# Patient Record
Sex: Female | Born: 1965 | Race: White | Hispanic: No | Marital: Married | Smoking: Former smoker
Health system: Southern US, Community
[De-identification: ages and names within clinical notes are randomized; demographics above are authoritative.]

## PROBLEM LIST (undated history)

## (undated) DIAGNOSIS — D649 Anemia, unspecified: Secondary | ICD-10-CM

## (undated) DIAGNOSIS — R519 Headache, unspecified: Secondary | ICD-10-CM

## (undated) DIAGNOSIS — R51 Headache: Secondary | ICD-10-CM

## (undated) DIAGNOSIS — E039 Hypothyroidism, unspecified: Secondary | ICD-10-CM

## (undated) HISTORY — PX: BUNIONECTOMY: SHX129

## (undated) HISTORY — PX: ABDOMINAL HYSTERECTOMY: SHX81

## (undated) HISTORY — PX: OTHER SURGICAL HISTORY: SHX169

---

## 2015-05-21 ENCOUNTER — Other Ambulatory Visit: Payer: Self-pay | Admitting: Internal Medicine

## 2015-05-21 ENCOUNTER — Other Ambulatory Visit: Payer: Self-pay | Admitting: Family Medicine

## 2015-05-21 DIAGNOSIS — R1031 Right lower quadrant pain: Secondary | ICD-10-CM

## 2015-05-21 DIAGNOSIS — K469 Unspecified abdominal hernia without obstruction or gangrene: Secondary | ICD-10-CM

## 2015-05-24 ENCOUNTER — Ambulatory Visit
Admission: RE | Admit: 2015-05-24 | Discharge: 2015-05-24 | Disposition: A | Discharge status: Home / Self Care | Payer: Commercial Managed Care - HMO | Source: Ambulatory Visit | Attending: Family Medicine | Admitting: Family Medicine

## 2015-05-24 ENCOUNTER — Inpatient Hospital Stay
Admission: RE | Admit: 2015-05-24 | Discharge: 2015-05-24 | Disposition: A | Discharge status: Home / Self Care | Payer: Self-pay | Source: Ambulatory Visit | Attending: Internal Medicine | Admitting: Internal Medicine

## 2015-05-24 DIAGNOSIS — R1031 Right lower quadrant pain: Secondary | ICD-10-CM

## 2015-05-24 DIAGNOSIS — K469 Unspecified abdominal hernia without obstruction or gangrene: Secondary | ICD-10-CM

## 2015-05-24 MED ORDER — IOPAMIDOL (ISOVUE-300) INJECTION 61%
100.0000 mL | Freq: Once | INTRAVENOUS | Status: AC | PRN
  Administered 2015-05-24: 100 mL via INTRAVENOUS

## 2015-06-28 ENCOUNTER — Ambulatory Visit: Payer: Self-pay | Admitting: General Surgery

## 2015-06-29 NOTE — Pre-Procedure Instructions (Signed)
Elmeda Suver  06/29/2015     No Pharmacies Listed   Your procedure is scheduled on Fri, May 19 @ 12:10 PM   Report to [facility] Admitting at 10:10 AM   Call this number if you have problems the morning of surgery:  [phone]   Remember:  Do not eat food or drink liquids after midnight.   Take these medicines the morning of surgery with A SIP OF WATER Gabapentin,levothyroxine               No Goody's,BC's,Aleve,Aspirin,Ibuprofen,Motrin,Advil,Fish Oil   Do not wear jewelry, make-up or nail polish.  Do not wear lotions, powders, or perfumes.    Do not shave 48 hours prior to surgery.    Do not bring valuables to the hospital.  [facility] is not responsible for any belongings or valuables.  Contacts, dentures or bridgework may not be worn into surgery.  Leave your suitcase in the car.  After surgery it may be brought to your room.  For patients admitted to the hospital, discharge time will be determined by your treatment team.  Patients discharged the day of surgery will not be allowed to drive home.    [facility]  Before surgery, you can play an important role.  Because skin is not sterile, your skin needs to be as free of germs as possible.  You can reduce the number of germs on you skin by washing with CHG (chlorahexidine gluconate) soap before surgery.  CHG is an antiseptic cleaner which kills germs and bonds with the skin to continue killing germs even after washing.  Please DO NOT use if you have an allergy to CHG or antibacterial soaps.  If your skin becomes reddened/irritated stop using the CHG and inform your nurse when you arrive at Short Stay.  Do not shave (including legs and underarms) for at least 48 hours prior to the first CHG shower.  You may shave your face.  Please follow these instructions carefully:   1.  Shower with CHG Soap the night before surgery and the   morning of Surgery.  2.  If  you choose to wash your hair, wash your hair first as usual with your  normal shampoo.  3.  After you shampoo, rinse your hair and body thoroughly to remove the    Shampoo.  4.  Use CHG as you would any other liquid soap.  You can apply chg directly to the skin and wash gently with scrungie or a clean washcloth.  5.  Apply the CHG Soap to your body ONLY FROM THE NECK DOWN.        Do not use on open wounds or open sores.  Avoid contact with your eyes,       ears, mouth and genitals (private parts).  Wash genitals (private parts  with your normal soap.  6.  Wash thoroughly, paying special attention to the area where your surgery  will be performed.  7.  Thoroughly rinse your body with warm water from the neck down.  8.  DO NOT shower/wash with your normal soap after using and rinsing off  the CHG Soap.  9.  Pat yourself dry with a clean towel.            10.  Wear clean pajamas.            11.  Place clean sheets on your bed the night of your first shower  and do not sleep with pets.  Day of Surgery  Do not apply any lotions/deoderants the morning of surgery.  Please wear clean clothes to the hospital/surgery center.

## 2015-06-30 ENCOUNTER — Encounter (HOSPITAL_COMMUNITY)
Admission: RE | Admit: 2015-06-30 | Discharge: 2015-06-30 | Disposition: A | Discharge status: Home / Self Care | Payer: Commercial Managed Care - HMO | Source: Ambulatory Visit | Attending: General Surgery | Admitting: General Surgery

## 2015-06-30 ENCOUNTER — Encounter (HOSPITAL_COMMUNITY): Payer: Self-pay

## 2015-06-30 DIAGNOSIS — E039 Hypothyroidism, unspecified: Secondary | ICD-10-CM | POA: Diagnosis not present

## 2015-06-30 DIAGNOSIS — Z87891 Personal history of nicotine dependence: Secondary | ICD-10-CM | POA: Diagnosis not present

## 2015-06-30 DIAGNOSIS — Z791 Long term (current) use of non-steroidal anti-inflammatories (NSAID): Secondary | ICD-10-CM | POA: Diagnosis not present

## 2015-06-30 DIAGNOSIS — Z79899 Other long term (current) drug therapy: Secondary | ICD-10-CM | POA: Diagnosis not present

## 2015-06-30 DIAGNOSIS — K4090 Unilateral inguinal hernia, without obstruction or gangrene, not specified as recurrent: Secondary | ICD-10-CM | POA: Diagnosis present

## 2015-06-30 HISTORY — DX: Hypothyroidism, unspecified: E03.9

## 2015-06-30 HISTORY — DX: Headache, unspecified: R51.9

## 2015-06-30 HISTORY — DX: Headache: R51

## 2015-06-30 HISTORY — DX: Anemia, unspecified: D64.9

## 2015-06-30 LAB — CBC WITH DIFFERENTIAL/PLATELET
BASOS ABS: 0 10*3/uL (ref 0.0–0.1)
Basophils Relative: 1 %
Eosinophils Absolute: 0.1 10*3/uL (ref 0.0–0.7)
Eosinophils Relative: 2 %
HEMATOCRIT: 41.1 % (ref 36.0–46.0)
Hemoglobin: 13.8 g/dL (ref 12.0–15.0)
LYMPHS PCT: 34 %
Lymphs Abs: 2.2 10*3/uL (ref 0.7–4.0)
MCH: 29.2 pg (ref 26.0–34.0)
MCHC: 33.6 g/dL (ref 30.0–36.0)
MCV: 87.1 fL (ref 78.0–100.0)
Monocytes Absolute: 0.5 10*3/uL (ref 0.1–1.0)
Monocytes Relative: 7 %
NEUTROS ABS: 3.6 10*3/uL (ref 1.7–7.7)
NEUTROS PCT: 56 %
Platelets: 221 10*3/uL (ref 150–400)
RBC: 4.72 MIL/uL (ref 3.87–5.11)
RDW: 14.3 % (ref 11.5–15.5)
WBC: 6.3 10*3/uL (ref 4.0–10.5)

## 2015-07-01 MED ORDER — CEFAZOLIN SODIUM-DEXTROSE 2-4 GM/100ML-% IV SOLN
2.0000 g | INTRAVENOUS | Status: AC
  Administered 2015-07-02: 2 g via INTRAVENOUS
  Filled 2015-07-01: qty 100

## 2015-07-02 ENCOUNTER — Observation Stay (HOSPITAL_COMMUNITY)
Admission: RE | Admit: 2015-07-02 | Discharge: 2015-07-02 | Disposition: A | Discharge status: Home / Self Care | Payer: Commercial Managed Care - HMO | Source: Ambulatory Visit | Attending: General Surgery | Admitting: General Surgery

## 2015-07-02 ENCOUNTER — Ambulatory Visit (HOSPITAL_COMMUNITY): Payer: Commercial Managed Care - HMO | Admitting: Certified Registered"

## 2015-07-02 ENCOUNTER — Encounter (HOSPITAL_COMMUNITY)
Admission: RE | Disposition: A | Discharge status: Home / Self Care | Payer: Self-pay | Source: Ambulatory Visit | Attending: General Surgery

## 2015-07-02 ENCOUNTER — Encounter (HOSPITAL_COMMUNITY): Payer: Self-pay | Admitting: Certified Registered"

## 2015-07-02 DIAGNOSIS — Z9889 Other specified postprocedural states: Secondary | ICD-10-CM

## 2015-07-02 DIAGNOSIS — E039 Hypothyroidism, unspecified: Secondary | ICD-10-CM | POA: Insufficient documentation

## 2015-07-02 DIAGNOSIS — Z87891 Personal history of nicotine dependence: Secondary | ICD-10-CM | POA: Insufficient documentation

## 2015-07-02 DIAGNOSIS — Z8719 Personal history of other diseases of the digestive system: Secondary | ICD-10-CM

## 2015-07-02 DIAGNOSIS — K4090 Unilateral inguinal hernia, without obstruction or gangrene, not specified as recurrent: Principal | ICD-10-CM | POA: Insufficient documentation

## 2015-07-02 DIAGNOSIS — Z791 Long term (current) use of non-steroidal anti-inflammatories (NSAID): Secondary | ICD-10-CM | POA: Insufficient documentation

## 2015-07-02 DIAGNOSIS — Z79899 Other long term (current) drug therapy: Secondary | ICD-10-CM | POA: Insufficient documentation

## 2015-07-02 HISTORY — PX: INGUINAL HERNIA REPAIR: SHX194

## 2015-07-02 HISTORY — PX: INSERTION OF MESH: SHX5868

## 2015-07-02 SURGERY — REPAIR, HERNIA, INGUINAL, LAPAROSCOPIC
Anesthesia: General | Site: Groin | Laterality: Right

## 2015-07-02 MED ORDER — PROPOFOL 10 MG/ML IV BOLUS
INTRAVENOUS | Status: AC
  Filled 2015-07-02: qty 20

## 2015-07-02 MED ORDER — FENTANYL CITRATE (PF) 100 MCG/2ML IJ SOLN
25.0000 ug | INTRAMUSCULAR | Status: DC | PRN
  Administered 2015-07-02: 25 ug via INTRAVENOUS
  Administered 2015-07-02: 50 ug via INTRAVENOUS

## 2015-07-02 MED ORDER — FENTANYL CITRATE (PF) 100 MCG/2ML IJ SOLN
INTRAMUSCULAR | Status: DC | PRN
  Administered 2015-07-02: 100 ug via INTRAVENOUS
  Administered 2015-07-02: 50 ug via INTRAVENOUS
  Administered 2015-07-02: 100 ug via INTRAVENOUS

## 2015-07-02 MED ORDER — LACTATED RINGERS IV SOLN
INTRAVENOUS | Status: DC
  Administered 2015-07-02 (×2): via INTRAVENOUS

## 2015-07-02 MED ORDER — FENTANYL CITRATE (PF) 100 MCG/2ML IJ SOLN
INTRAMUSCULAR | Status: AC
  Filled 2015-07-02: qty 2

## 2015-07-02 MED ORDER — MIDAZOLAM HCL 2 MG/2ML IJ SOLN
INTRAMUSCULAR | Status: AC
  Filled 2015-07-02: qty 2

## 2015-07-02 MED ORDER — OXYCODONE-ACETAMINOPHEN 5-325 MG PO TABS: 1.0000 | ORAL_TABLET | ORAL | Status: DC | PRN

## 2015-07-02 MED ORDER — ACETAMINOPHEN 325 MG PO TABS
650.0000 mg | ORAL_TABLET | ORAL | Status: DC | PRN
  Administered 2015-07-02: 650 mg via ORAL

## 2015-07-02 MED ORDER — BUPIVACAINE HCL (PF) 0.25 % IJ SOLN
INTRAMUSCULAR | Status: AC
  Filled 2015-07-02: qty 30

## 2015-07-02 MED ORDER — ACETAMINOPHEN 325 MG PO TABS
ORAL_TABLET | ORAL | Status: AC
  Filled 2015-07-02: qty 2

## 2015-07-02 MED ORDER — BUPIVACAINE HCL 0.25 % IJ SOLN
INTRAMUSCULAR | Status: DC | PRN
Start: 2015-07-02 — End: 2015-07-02
  Administered 2015-07-02: 4 mL

## 2015-07-02 MED ORDER — MORPHINE SULFATE (PF) 2 MG/ML IV SOLN: 2.0000 mg | INTRAVENOUS | Status: DC | PRN

## 2015-07-02 MED ORDER — ROCURONIUM BROMIDE 100 MG/10ML IV SOLN
INTRAVENOUS | Status: DC | PRN
  Administered 2015-07-02: 40 mg via INTRAVENOUS

## 2015-07-02 MED ORDER — CHLORHEXIDINE GLUCONATE 4 % EX LIQD: 1.0000 "application " | Freq: Once | CUTANEOUS | Status: DC

## 2015-07-02 MED ORDER — SUGAMMADEX SODIUM 200 MG/2ML IV SOLN
INTRAVENOUS | Status: DC | PRN
  Administered 2015-07-02: 200 mg via INTRAVENOUS

## 2015-07-02 MED ORDER — 0.9 % SODIUM CHLORIDE (POUR BTL) OPTIME
TOPICAL | Status: DC | PRN
  Administered 2015-07-02: 1000 mL

## 2015-07-02 MED ORDER — PROPOFOL 10 MG/ML IV BOLUS
INTRAVENOUS | Status: DC | PRN
  Administered 2015-07-02: 30 mg via INTRAVENOUS
  Administered 2015-07-02: 100 mg via INTRAVENOUS

## 2015-07-02 MED ORDER — OXYCODONE HCL 5 MG PO TABS
5.0000 mg | ORAL_TABLET | ORAL | Status: DC | PRN
  Administered 2015-07-02: 10 mg via ORAL

## 2015-07-02 MED ORDER — OXYCODONE HCL 5 MG PO TABS
ORAL_TABLET | ORAL | Status: AC
  Filled 2015-07-02: qty 2

## 2015-07-02 MED ORDER — GABAPENTIN 300 MG PO CAPS: 600.0000 mg | ORAL_CAPSULE | Freq: Two times a day (BID) | ORAL | Status: DC

## 2015-07-02 MED ORDER — ONDANSETRON HCL 4 MG/2ML IJ SOLN
INTRAMUSCULAR | Status: DC | PRN
  Administered 2015-07-02: 4 mg via INTRAVENOUS

## 2015-07-02 MED ORDER — MIDAZOLAM HCL 5 MG/5ML IJ SOLN
INTRAMUSCULAR | Status: DC | PRN
  Administered 2015-07-02: 2 mg via INTRAVENOUS

## 2015-07-02 MED ORDER — SODIUM CHLORIDE 0.9 % IR SOLN
Status: DC | PRN
  Administered 2015-07-02: 1

## 2015-07-02 MED ORDER — ACETAMINOPHEN 650 MG RE SUPP: 650.0000 mg | RECTAL | Status: DC | PRN

## 2015-07-02 MED ORDER — LIDOCAINE HCL (CARDIAC) 20 MG/ML IV SOLN
INTRAVENOUS | Status: DC | PRN
  Administered 2015-07-02: 100 mg via INTRAVENOUS

## 2015-07-02 MED ORDER — FENTANYL CITRATE (PF) 250 MCG/5ML IJ SOLN
INTRAMUSCULAR | Status: AC
  Filled 2015-07-02: qty 5

## 2015-07-02 SURGICAL SUPPLY — 46 items
APPLIER CLIP 5 13 M/L LIGAMAX5 (MISCELLANEOUS)
BENZOIN TINCTURE PRP APPL 2/3 (GAUZE/BANDAGES/DRESSINGS) ×4 IMPLANT
CANISTER SUCTION 2500CC (MISCELLANEOUS) IMPLANT
CHLORAPREP W/TINT 26ML (MISCELLANEOUS) ×4 IMPLANT
CLIP APPLIE 5 13 M/L LIGAMAX5 (MISCELLANEOUS) IMPLANT
CLOSURE WOUND 1/2 X4 (GAUZE/BANDAGES/DRESSINGS) ×1
COVER SURGICAL LIGHT HANDLE (MISCELLANEOUS) ×4 IMPLANT
DISSECTOR BLUNT TIP ENDO 5MM (MISCELLANEOUS) IMPLANT
ELECT REM PT RETURN 9FT ADLT (ELECTROSURGICAL) ×4
ELECTRODE REM PT RTRN 9FT ADLT (ELECTROSURGICAL) ×2 IMPLANT
GAUZE SPONGE 2X2 8PLY STRL LF (GAUZE/BANDAGES/DRESSINGS) ×2 IMPLANT
GLOVE BIO SURGEON STRL SZ7.5 (GLOVE) ×8 IMPLANT
GLOVE BIOGEL PI IND STRL 7.0 (GLOVE) ×2 IMPLANT
GLOVE BIOGEL PI IND STRL 7.5 (GLOVE) ×2 IMPLANT
GLOVE BIOGEL PI INDICATOR 7.0 (GLOVE) ×2
GLOVE BIOGEL PI INDICATOR 7.5 (GLOVE) ×2
GLOVE SURG SS PI 7.0 STRL IVOR (GLOVE) ×4 IMPLANT
GOWN STRL REUS W/ TWL LRG LVL3 (GOWN DISPOSABLE) ×4 IMPLANT
GOWN STRL REUS W/ TWL XL LVL3 (GOWN DISPOSABLE) ×2 IMPLANT
GOWN STRL REUS W/TWL LRG LVL3 (GOWN DISPOSABLE) ×4
GOWN STRL REUS W/TWL XL LVL3 (GOWN DISPOSABLE) ×2
KIT BASIN OR (CUSTOM PROCEDURE TRAY) ×4 IMPLANT
KIT ROOM TURNOVER OR (KITS) ×4 IMPLANT
MESH 3DMAX 4X6 RT LRG (Mesh General) ×4 IMPLANT
NEEDLE INSUFFLATION 14GA 120MM (NEEDLE) IMPLANT
NS IRRIG 1000ML POUR BTL (IV SOLUTION) ×4 IMPLANT
PAD ARMBOARD 7.5X6 YLW CONV (MISCELLANEOUS) ×8 IMPLANT
RELOAD STAPLE HERNIA 4.0 BLUE (INSTRUMENTS) ×4 IMPLANT
RELOAD STAPLE HERNIA 4.8 BLK (STAPLE) IMPLANT
SCISSORS LAP 5X35 DISP (ENDOMECHANICALS) ×4 IMPLANT
SET IRRIG TUBING LAPAROSCOPIC (IRRIGATION / IRRIGATOR) IMPLANT
SET TROCAR LAP APPLE-HUNT 5MM (ENDOMECHANICALS) ×4 IMPLANT
SPONGE GAUZE 2X2 STER 10/PKG (GAUZE/BANDAGES/DRESSINGS) ×2
STAPLER HERNIA 12 8.5 360D (INSTRUMENTS) ×4 IMPLANT
STRIP CLOSURE SKIN 1/2X4 (GAUZE/BANDAGES/DRESSINGS) ×3 IMPLANT
SUT MNCRL AB 4-0 PS2 18 (SUTURE) ×4 IMPLANT
SUT VIC AB 1 CT1 27 (SUTURE)
SUT VIC AB 1 CT1 27XBRD ANBCTR (SUTURE) IMPLANT
SYRINGE TOOMEY DISP (SYRINGE) IMPLANT
TOWEL OR 17X24 6PK STRL BLUE (TOWEL DISPOSABLE) ×4 IMPLANT
TOWEL OR 17X26 10 PK STRL BLUE (TOWEL DISPOSABLE) ×4 IMPLANT
TRAY FOLEY CATH 16FR SILVER (SET/KITS/TRAYS/PACK) ×4 IMPLANT
TRAY LAPAROSCOPIC MC (CUSTOM PROCEDURE TRAY) ×4 IMPLANT
TROCAR XCEL 12X100 BLDLESS (ENDOMECHANICALS) ×4 IMPLANT
TUBING INSUFFLATION (TUBING) ×4 IMPLANT
WATER STERILE IRR 1000ML POUR (IV SOLUTION) ×4 IMPLANT

## 2015-07-02 NOTE — Op Note (Signed)
07/02/2015  12:24 PM  PATIENT:  Jessica Munoz  50 y.o. female  PRE-OPERATIVE DIAGNOSIS:  Right inguinal hernia   POST-OPERATIVE DIAGNOSIS:  Right indirect inguinal hernia   PROCEDURE:  Procedure(s): LAPAROSCOPIC RIGHT INGUINAL HERNIA WITH MESH  (Right) INSERTION OF MESH (Right)  SURGEON:  Surgeon(s) and Role:    * Ralene Ok, MD - Primary  ANESTHESIA:   local and general  EBL:  Total I/O In: 1000 [I.V.:1000] Out: -   BLOOD ADMINISTERED:none  DRAINS: none   LOCAL MEDICATIONS USED:  BUPIVICAINE   SPECIMEN:  Source of Specimen:  none  DISPOSITION OF SPECIMEN:  PATHOLOGY  COUNTS:  YES  TOURNIQUET:  * No tourniquets in log *  DICTATION: .Dragon Dictation   Counts: reported as correct x 2  Findings:  The patient had a small right indirect hernia  Indications for procedure:  The patient is a 50 year old female with a right hernia for several months. Patient complained of symptomatology to his right inguinal area. The patient was taken back for elective inguinal hernia repair.  Details of the procedure: The patient was taken back to the operating room. The patient was placed in supine position with bilateral SCDs in place.  The patient was prepped and draped in the usual sterile fashion.  After appropriate anitbiotics were confirmed, a time-out was confirmed and all facts were verified.  0.25% Marcaine was used to infiltrate the umbilical area. A 11-blade was used to cut down the skin and blunt dissection was used to get the anterior fashion.  The anterior fascia was incised approximately 1 cm and the muscles were retracted laterally. Blunt dissection was then used to create a space in the preperitoneal area. At this time a 10 mm camera was then introduced into the space and advanced the pubic tubercle and a 12 mm trocar was placed over this and insufflation was started.  At this time and space was created from medial to laterally the preperitoneal space.  Cooper's  ligament was initially cleaned off.  The hernia sac was identified in the right indirect space with associated lipoma within the indirect canal. Dissection of the hernia sac was undertaken.  The round ligament was cauterized and transected.  Once the hernia sac was taken down to approximately the umbilicus and the lipoma was dissected away from the internal ring,  a Bard 3D Max mesh, size: Large, was  introduced into the preperitoneal space.  The mesh was brought over to cover the direct and indirect hernia spaces.  This was anchored into place and secured to Cooper's ligament with 4.25mm staples from a Coviden hernia stapler. It was anchored to the anterior abdominal wall with 4.8 mm staples. The hernia sac was seen lying posterior to the mesh. There was no staples placed laterally. The insufflation was evacuated and the peritoneum was seen posterior to the mesh. The trochars were removed. The anterior fascia was reapproximated using #1 Vicryl on a UR- 6.  Intra-abdominal air was evacuated and the Veress needle removed. The skin was reapproximated using 4-0 Monocryl subcuticular fashion the patient was awakened from general anesthesia and taken to recovery in stable condition.   PLAN OF CARE: Discharge to home after PACU  PATIENT DISPOSITION:  PACU - hemodynamically stable.   Delay start of Pharmacological VTE agent (>24hrs) due to surgical blood loss or risk of bleeding: not applicable

## 2015-07-02 NOTE — H&P (Signed)
History of Present Illness Ralene Ok MD; 06/10/2015 10:20 AM) Patient words: reck.  The patient is a 50 year old female who presents with an inguinal hernia. The patient is a 50 year old female who is referred by Kelton Pillar, M.D. for evaluation of a right inguinal hernia. Patient states that she notices a bulge to the right inguinal area. She underwent a CT scan which revealed a right inguinal hernia.  Patient works at [employer], does some heavy lifting   Other Problems Marjean Donna, CMA; 06/10/2015 9:56 AM) Bladder Problems Hemorrhoids Migraine Headache Thyroid Disease  Past Surgical History Marjean Donna, [place]; 06/10/2015 9:56 AM) Hysterectomy (not due to cancer) - Partial  Diagnostic Studies History Marjean Donna, CMA; 06/10/2015 9:56 AM) Colonoscopy never Mammogram within last year Pap Smear 1-5 years ago  Allergies Davy Pique Bynum, CMA; 06/10/2015 9:58 AM) No Known Drug Allergies04/27/2017  Medication History (Sonya Bynum, CMA; 06/10/2015 9:59 AM) Gabapentin (300MG  Capsule, Oral) Active. Ketoprofen (75MG  Capsule, Oral) Active. Levothyroxine Sodium (50MCG Tablet, Oral) Active. Methocarbamol (500MG  Tablet, Oral) Active. ValACYclovir HCl (1GM Tablet, Oral) Active. Valtrex (1GM Tablet, Oral) Active. Medications Reconciled  Social History Marjean Donna, CMA; 06/10/2015 9:56 AM) Alcohol use Moderate alcohol use. Caffeine use Coffee. No drug use Tobacco use Former smoker.  Family History Marjean Donna, [place]; 06/10/2015 9:56 AM) First Degree Relatives No pertinent family history  Pregnancy / Birth History Marjean Donna, [place]; 06/10/2015 9:56 AM) Durenda Age 4 Maternal age 28-20 Para 4    Review of Systems Davy Pique Bynum CMA; 06/10/2015 9:56 AM) General Not Present- Appetite Loss, Chills, Fatigue, Fever, Night Sweats, Weight Gain and Weight Loss. Skin Not Present- Change in Wart/Mole, Dryness, Hives, Jaundice, New Lesions, Non-Healing Wounds, Rash  and Ulcer. HEENT Present- Seasonal Allergies and Wears glasses/contact lenses. Not Present- Earache, Hearing Loss, Hoarseness, Nose Bleed, Oral Ulcers, Ringing in the Ears, Sinus Pain, Sore Throat, Visual Disturbances and Yellow Eyes. Respiratory Not Present- Bloody sputum, Chronic Cough, Difficulty Breathing, Snoring and Wheezing. Breast Not Present- Breast Mass, Breast Pain, Nipple Discharge and Skin Changes. Cardiovascular Present- Leg Cramps. Not Present- Chest Pain, Difficulty Breathing Lying Down, Palpitations, Rapid Heart Rate, Shortness of Breath and Swelling of Extremities. Gastrointestinal Present- Change in Bowel Habits and Hemorrhoids. Not Present- Abdominal Pain, Bloating, Bloody Stool, Chronic diarrhea, Constipation, Difficulty Swallowing, Excessive gas, Gets full quickly at meals, Indigestion, Nausea, Rectal Pain and Vomiting. Female Genitourinary Present- Frequency, Pelvic Pain and Urgency. Not Present- Nocturia and Painful Urination. Musculoskeletal Not Present- Back Pain, Joint Pain, Joint Stiffness, Muscle Pain, Muscle Weakness and Swelling of Extremities. Neurological Present- Headaches. Not Present- Decreased Memory, Fainting, Numbness, Seizures, Tingling, Tremor, Trouble walking and Weakness. Psychiatric Not Present- Anxiety, Bipolar, Change in Sleep Pattern, Depression, Fearful and Frequent crying. Endocrine Not Present- Cold Intolerance, Excessive Hunger, Hair Changes, Heat Intolerance, Hot flashes and New Diabetes. Hematology Present- Easy Bruising. Not Present- Excessive bleeding, Gland problems, HIV and Persistent Infections.  Vitals (Sonya Bynum CMA; 06/10/2015 9:57 AM) 06/10/2015 9:57 AM Weight: 142 lb Height: 63in Body Surface Area: 1.67 m Body Mass Index: 25.15 kg/m  Temp.: 72F(Temporal)  Pulse: 76 (Regular)  BP: 128/80 (Sitting, Left Arm, Standard)       Physical Exam Ralene Ok MD; 06/10/2015 10:20 AM) General Mental  Status-Alert. General Appearance-Consistent with stated age. Hydration-Well hydrated. Voice-Normal.  Head and Neck Head-normocephalic, atraumatic with no lesions or palpable masses. Trachea-midline.  Eye Eyeball - Bilateral-Extraocular movements intact. Sclera/Conjunctiva - Bilateral-No scleral icterus.  Chest and Lung Exam Chest and lung exam reveals -quiet, even and  easy respiratory effort with no use of accessory muscles. Inspection Chest Wall - Normal. Back - normal.  Cardiovascular Cardiovascular examination reveals -normal heart sounds, regular rate and rhythm with no murmurs.  Abdomen Inspection Skin - Scar - no surgical scars. Hernias - Inguinal hernia - Right - Reducible(Likely direct). Palpation/Percussion Normal exam - Soft, Non Tender, No Rebound tenderness, No Rigidity (guarding) and No hepatosplenomegaly. Auscultation Normal exam - Bowel sounds normal.  Neurologic Neurologic evaluation reveals -alert and oriented x 3 with no impairment of recent or remote memory. Mental Status-Normal.  Musculoskeletal Normal Exam - Left-Upper Extremity Strength Normal and Lower Extremity Strength Normal. Normal Exam - Right-Upper Extremity Strength Normal, Lower Extremity Weakness.    Assessment & Plan Ralene Ok MD; 06/10/2015 10:21 AM) RIGHT INGUINAL HERNIA (K40.90) Impression: 50 year old female with a right inguinal hernia.  1. The patient will like to proceed to the operating room for laparoscopic right inguinal hernia repair with mesh.  2. I discussed with the patient the signs and symptoms of incarceration and strangulation and the need to proceed to the ER should they occur.  3. I discussed with the patient the risks and benefits of the procedure to include but not limited to: Infection, bleeding, damage to surrounding structures, possible need for further surgery, possible nerve pain, and possible recurrence. The patient was  understanding and wishes to proceed.

## 2015-07-02 NOTE — Anesthesia Procedure Notes (Signed)
Procedure Name: Intubation Date/Time: 07/02/2015 11:44 AM Performed by: Manuela Schwartz B Pre-anesthesia Checklist: Patient identified, Emergency Drugs available, Suction available, Patient being monitored and Timeout performed Patient Re-evaluated:Patient Re-evaluated prior to inductionOxygen Delivery Method: Circle system utilized Preoxygenation: Pre-oxygenation with 100% oxygen Intubation Type: IV induction Ventilation: Mask ventilation without difficulty Laryngoscope Size: Mac and 3 Grade View: Grade I Tube type: Oral Tube size: 7.0 mm Number of attempts: 1 Airway Equipment and Method: Stylet Placement Confirmation: ETT inserted through vocal cords under direct vision,  positive ETCO2 and breath sounds checked- equal and bilateral Secured at: 21 cm Tube secured with: Tape Dental Injury: Teeth and Oropharynx as per pre-operative assessment

## 2015-07-02 NOTE — Discharge Instructions (Signed)
[facility] _______[facility] ° °INGUINAL HERNIA REPAIR: POST OP INSTRUCTIONS ° °Always review your discharge instruction sheet given to you by the facility where your surgery was performed. °IF YOU HAVE DISABILITY OR FAMILY LEAVE FORMS, YOU MUST BRING THEM TO THE OFFICE FOR PROCESSING.   °DO NOT GIVE THEM TO YOUR DOCTOR. ° °1. A  prescription for pain medication may be given to you upon discharge.  Take your pain medication as prescribed, if needed.  If narcotic pain medicine is not needed, then you may take acetaminophen (Tylenol) or ibuprofen (Advil) as needed. °2. Take your usually prescribed medications unless otherwise directed. °3. If you need a refill on your pain medication, please contact your pharmacy.  They will contact our office to request authorization. Prescriptions will not be filled after 5 pm or on week-ends. °4. You should follow a light diet the first 24 hours after arrival home, such as soup and crackers, etc.  Be sure to include lots of fluids daily.  Resume your normal diet the day after surgery. °5. Most patients will experience some swelling and bruising around the umbilicus or in the groin and scrotum.  Ice packs and reclining will help.  Swelling and bruising can take several days to resolve.  °6. It is common to experience some constipation if taking pain medication after surgery.  Increasing fluid intake and taking a stool softener (such as Colace) will usually help or prevent this problem from occurring.  A mild laxative (Milk of Magnesia or Miralax) should be taken according to package directions if there are no bowel movements after 48 hours. °7. Unless discharge instructions indicate otherwise, you may remove your bandages 24-48 hours after surgery, and you may shower at that time.  You may have steri-strips (small skin tapes) in place directly over the incision.  These strips should be left on the skin for 7-10 days.  If your surgeon used skin glue on the incision, you  may shower in 24 hours.  The glue will flake off over the next 2-3 weeks.  Any sutures or staples will be removed at the office during your follow-up visit. °8. ACTIVITIES:  You may resume regular (light) daily activities beginning the next day--such as daily self-care, walking, climbing stairs--gradually increasing activities as tolerated.  You may have sexual intercourse when it is comfortable.  Refrain from any heavy lifting or straining until approved by your doctor. °a. You may drive when you are no longer taking prescription pain medication, you can comfortably wear a seatbelt, and you can safely maneuver your car and apply brakes. °b. RETURN TO WORK:  __________________________________________________________ °9. You should see your doctor in the office for a follow-up appointment approximately 2-3 weeks after your surgery.  Make sure that you call for this appointment within a day or two after you arrive home to insure a convenient appointment time. °10. OTHER INSTRUCTIONS:  __________________________________________________________________________________________________________________________________________________________________________________________  °WHEN TO CALL YOUR DOCTOR: °1. Fever over 101.0 °2. Inability to urinate °3. Nausea and/or vomiting °4. Extreme swelling or bruising °5. Continued bleeding from incision. °6. Increased pain, redness, or drainage from the incision ° °The clinic staff is available to answer your questions during regular business hours.  Please don’t hesitate to call and ask to speak to one of the nurses for clinical concerns.  If you have a medical emergency, go to the nearest emergency room or call 911.  A surgeon from [facility] is always on call at the hospital ° ° °[address], [address], [place], [place]  [address] ? ° [address] [address], [place], [place]   [address] °[phone] ? [phone] ? FAX [phone] °Web site:  [email or web address] ° °

## 2015-07-02 NOTE — Anesthesia Preprocedure Evaluation (Signed)
Anesthesia Evaluation  Patient identified by MRN, date of birth, ID band Patient awake    Reviewed: Allergy & Precautions, NPO status , Patient's Chart, lab work & pertinent test results  Airway Mallampati: II  TM Distance: >3 FB Neck ROM: Full    Dental  (+) Teeth Intact, Dental Advisory Given   Pulmonary former smoker,    Pulmonary exam normal breath sounds clear to auscultation       Cardiovascular Exercise Tolerance: Good negative cardio ROS Normal cardiovascular exam Rhythm:Regular Rate:Normal     Neuro/Psych  Headaches, negative psych ROS   GI/Hepatic negative GI ROS, Neg liver ROS,   Endo/Other  Hypothyroidism   Renal/GU negative Renal ROS     Musculoskeletal negative musculoskeletal ROS (+)   Abdominal   Peds  Hematology negative hematology ROS (+)   Anesthesia Other Findings Day of surgery medications reviewed with the patient.  Reproductive/Obstetrics                             Anesthesia Physical Anesthesia Plan  ASA: II  Anesthesia Plan: General   Post-op Pain Management:    Induction: Intravenous  Airway Management Planned: Oral ETT  Additional Equipment:   Intra-op Plan:   Post-operative Plan: Extubation in OR  Informed Consent: I have reviewed the patients History and Physical, chart, labs and discussed the procedure including the risks, benefits and alternatives for the proposed anesthesia with the patient or authorized representative who has indicated his/her understanding and acceptance.   Dental advisory given  Plan Discussed with: CRNA  Anesthesia Plan Comments: (Risks/benefits of general anesthesia discussed with patient including risk of damage to teeth, lips, gum, and tongue, nausea/vomiting, allergic reactions to medications, and the possibility of heart attack, stroke and death.  All patient questions answered.  Patient wishes to proceed.)         Anesthesia Quick Evaluation

## 2015-07-02 NOTE — Anesthesia Postprocedure Evaluation (Signed)
Anesthesia Post Note  Patient: Jessica Munoz  Procedure(s) Performed: Procedure(s) (LRB): LAPAROSCOPIC RIGHT INGUINAL HERNIA WITH MESH  (Right) INSERTION OF MESH (Right)  Patient location during evaluation: PACU Anesthesia Type: General Level of consciousness: awake and alert Pain management: pain level controlled Vital Signs Assessment: post-procedure vital signs reviewed and stable Respiratory status: spontaneous breathing, nonlabored ventilation, respiratory function stable and patient connected to nasal cannula oxygen Cardiovascular status: blood pressure returned to baseline and stable Postop Assessment: no signs of nausea or vomiting Anesthetic complications: no    Last Vitals:  Filed Vitals:   07/02/15 1238 07/02/15 1254  BP: 129/93 127/84  Pulse: 88 80  Temp: 36.7 C   Resp: 12 14    Last Pain:  Filed Vitals:   07/02/15 1349  PainSc: 3                  Catalina Gravel

## 2015-07-02 NOTE — Progress Notes (Signed)
Partial teeth and eyeglasses returned to  Pt on admission to PACU by CRNA

## 2015-07-02 NOTE — Transfer of Care (Signed)
Immediate Anesthesia Transfer of Care Note  Patient: Jessica Munoz  Procedure(s) Performed: Procedure(s): LAPAROSCOPIC RIGHT INGUINAL HERNIA WITH MESH  (Right) INSERTION OF MESH (Right)  Patient Location: PACU  Anesthesia Type:General  Level of Consciousness: awake, alert  and oriented  Airway & Oxygen Therapy: Patient Spontanous Breathing and Patient connected to nasal cannula oxygen  Post-op Assessment: Report given to RN and Post -op Vital signs reviewed and stable  Post vital signs: Reviewed and stable  Last Vitals:  Filed Vitals:   07/02/15 1108  BP: 134/77  Pulse: 77  Temp: 36.8 C  Resp: 18    Last Pain: There were no vitals filed for this visit.       Complications: No apparent anesthesia complications

## 2015-07-02 NOTE — Interval H&P Note (Signed)
History and Physical Interval Note:  07/02/2015 11:12 AM  Jessica Munoz  has presented today for surgery, with the diagnosis of Right inguinal hernia   The various methods of treatment have been discussed with the patient and family. After consideration of risks, benefits and other options for treatment, the patient has consented to  Procedure(s): LAPAROSCOPIC RIGHT INGUINAL HERNIA WITH MESH  (Right) INSERTION OF MESH (Right) as a surgical intervention .  The patient's history has been reviewed, patient examined, no change in status, stable for surgery.  I have reviewed the patient's chart and labs.  Questions were answered to the patient's satisfaction.     Rosario Jacks., Anne Hahn

## 2015-07-05 NOTE — Discharge Summary (Signed)
Physician Discharge Summary  Patient ID: [name] MRN: [id number] DOB/AGE: 50-Jun-1967 50 y.o.  Admit date: 07/02/2015 Discharge date: 07/05/2015  Admission Diagnoses:status post hernia repair  Discharge Diagnoses:  Active Problems:   S/P hernia repair   Discharged Condition: good  Hospital Course: patient is a 50 year old female status post hernia repair.  Patient was doing well postoperatively.  She was sent to the floor.  She started on a liquid diet advanced to a regular diet.  She tolerated that well.  She was afebrile, and bleeding, had good pain control, and was deeme for discharge and disc.  Consults: None  Significant Diagnostic Studies: none  Treatments: surgery: as above  Discharge Exam: Blood pressure 126/82, pulse 80, temperature 98 F (36.7 C), temperature source Oral, resp. rate 16, height 5\' 3"  (1.6 m), weight 66.356 kg (146 lb 4.6 oz), SpO2 97 %. General appearance: alert and cooperative Cardio: regular rate and rhythm, S1, S2 normal, no murmur, click, rub or gallop GI: soft, non-tender; bowel sounds normal; no masses,  no organomegaly and incision c//di  Disposition: 01-Home or Self Care     Medication List    TAKE these medications        gabapentin 300 MG capsule  Commonly known as:  NEURONTIN  Take 600-900 mg by mouth 2 (two) times daily. 600mg  in the morning and 900mg  in the evening     isometheptene-acetaminophen-dichloralphenazone 65-100-325 MG capsule  Commonly known as:  MIDRIN  Take 1 capsule by mouth 4 (four) times daily as needed for migraine. Maximum 5 capsules in 12 hours for migraine headaches, 8 capsules in 24 hours for tension headaches.     ketoprofen 75 MG capsule  Commonly known as:  ORUDIS  Take 75 mg by mouth 3 (three) times daily as needed. headache     levothyroxine 50 MCG tablet  Commonly known as:  SYNTHROID, LEVOTHROID  Take 50 mcg by mouth daily.     oxyCODONE-acetaminophen 5-325 MG tablet  Commonly known as:   ROXICET  Take 1-2 tablets by mouth every 4 (four) hours as needed.     valACYclovir 1000 MG tablet  Commonly known as:  VALTREX  Take 1 g by mouth 2 (two) times daily as needed. Cold sore           Follow-up Information    Follow up with Reyes Ivan, MD. Schedule an appointment as soon as possible for a visit in 2 weeks.   Specialty:  General Surgery   Why:  For wound re-check   Contact information:   [address] [address] [phone]       Signed: Rosario Jacks., [facility] 07/05/2015, 4:56 PM

## 2015-07-06 ENCOUNTER — Encounter (HOSPITAL_COMMUNITY): Payer: Self-pay | Admitting: General Surgery

## 2016-03-17 DIAGNOSIS — G4453 Primary thunderclap headache: Secondary | ICD-10-CM | POA: Diagnosis not present

## 2016-03-17 DIAGNOSIS — G44219 Episodic tension-type headache, not intractable: Secondary | ICD-10-CM | POA: Diagnosis not present

## 2016-03-17 DIAGNOSIS — G43019 Migraine without aura, intractable, without status migrainosus: Secondary | ICD-10-CM | POA: Diagnosis not present

## 2016-03-24 ENCOUNTER — Other Ambulatory Visit: Payer: Self-pay | Admitting: Neurology

## 2016-03-24 DIAGNOSIS — R519 Headache, unspecified: Secondary | ICD-10-CM

## 2016-03-24 DIAGNOSIS — R51 Headache: Principal | ICD-10-CM

## 2016-04-03 ENCOUNTER — Ambulatory Visit
Admission: RE | Admit: 2016-04-03 | Discharge: 2016-04-03 | Disposition: A | Discharge status: Home / Self Care | Payer: Commercial Managed Care - HMO | Source: Ambulatory Visit | Attending: Neurology | Admitting: Neurology

## 2016-04-03 DIAGNOSIS — R519 Headache, unspecified: Secondary | ICD-10-CM

## 2016-04-03 DIAGNOSIS — R51 Headache: Secondary | ICD-10-CM | POA: Diagnosis not present

## 2016-05-05 DIAGNOSIS — H60331 Swimmer's ear, right ear: Secondary | ICD-10-CM | POA: Diagnosis not present

## 2016-08-03 DIAGNOSIS — Z0100 Encounter for examination of eyes and vision without abnormal findings: Secondary | ICD-10-CM | POA: Diagnosis not present

## 2016-08-22 DIAGNOSIS — Z131 Encounter for screening for diabetes mellitus: Secondary | ICD-10-CM | POA: Diagnosis not present

## 2016-08-22 DIAGNOSIS — Z0000 Encounter for general adult medical examination without abnormal findings: Secondary | ICD-10-CM | POA: Diagnosis not present

## 2016-08-22 DIAGNOSIS — E785 Hyperlipidemia, unspecified: Secondary | ICD-10-CM | POA: Diagnosis not present

## 2016-08-22 DIAGNOSIS — E039 Hypothyroidism, unspecified: Secondary | ICD-10-CM | POA: Diagnosis not present

## 2016-08-31 DIAGNOSIS — G43009 Migraine without aura, not intractable, without status migrainosus: Secondary | ICD-10-CM | POA: Diagnosis not present

## 2016-08-31 DIAGNOSIS — G4489 Other headache syndrome: Secondary | ICD-10-CM | POA: Diagnosis not present

## 2016-08-31 DIAGNOSIS — G44219 Episodic tension-type headache, not intractable: Secondary | ICD-10-CM | POA: Diagnosis not present

## 2016-11-21 ENCOUNTER — Other Ambulatory Visit: Payer: Self-pay | Admitting: Physician Assistant

## 2016-11-21 ENCOUNTER — Ambulatory Visit
Admission: RE | Admit: 2016-11-21 | Discharge: 2016-11-21 | Disposition: A | Discharge status: Home / Self Care | Payer: 59 | Source: Ambulatory Visit | Attending: Physician Assistant | Admitting: Physician Assistant

## 2016-11-21 DIAGNOSIS — M7989 Other specified soft tissue disorders: Secondary | ICD-10-CM | POA: Diagnosis not present

## 2016-11-21 DIAGNOSIS — Z23 Encounter for immunization: Secondary | ICD-10-CM | POA: Diagnosis not present

## 2016-11-21 DIAGNOSIS — S8991XA Unspecified injury of right lower leg, initial encounter: Secondary | ICD-10-CM | POA: Diagnosis not present

## 2016-11-21 DIAGNOSIS — T1490XA Injury, unspecified, initial encounter: Secondary | ICD-10-CM

## 2016-11-21 DIAGNOSIS — M79606 Pain in leg, unspecified: Secondary | ICD-10-CM | POA: Diagnosis not present

## 2017-02-17 ENCOUNTER — Encounter (HOSPITAL_BASED_OUTPATIENT_CLINIC_OR_DEPARTMENT_OTHER): Payer: Self-pay | Admitting: Emergency Medicine

## 2017-02-17 ENCOUNTER — Emergency Department (HOSPITAL_BASED_OUTPATIENT_CLINIC_OR_DEPARTMENT_OTHER)
Admission: EM | Admit: 2017-02-17 | Discharge: 2017-02-17 | Disposition: A | Discharge status: Home / Self Care | Payer: No Typology Code available for payment source | Attending: Emergency Medicine | Admitting: Emergency Medicine

## 2017-02-17 ENCOUNTER — Emergency Department (HOSPITAL_BASED_OUTPATIENT_CLINIC_OR_DEPARTMENT_OTHER): Payer: No Typology Code available for payment source

## 2017-02-17 ENCOUNTER — Other Ambulatory Visit: Payer: Self-pay

## 2017-02-17 DIAGNOSIS — Z79899 Other long term (current) drug therapy: Secondary | ICD-10-CM | POA: Insufficient documentation

## 2017-02-17 DIAGNOSIS — R51 Headache: Secondary | ICD-10-CM | POA: Diagnosis present

## 2017-02-17 DIAGNOSIS — E039 Hypothyroidism, unspecified: Secondary | ICD-10-CM | POA: Insufficient documentation

## 2017-02-17 DIAGNOSIS — Z87891 Personal history of nicotine dependence: Secondary | ICD-10-CM | POA: Diagnosis not present

## 2017-02-17 DIAGNOSIS — M542 Cervicalgia: Secondary | ICD-10-CM | POA: Insufficient documentation

## 2017-02-17 MED ORDER — ACETAMINOPHEN 500 MG PO TABS: 500.0000 mg | ORAL_TABLET | Freq: Four times a day (QID) | ORAL | 0 refills | Status: DC | PRN

## 2017-02-17 MED ORDER — IBUPROFEN 600 MG PO TABS: 600.0000 mg | ORAL_TABLET | Freq: Four times a day (QID) | ORAL | 0 refills | Status: DC | PRN

## 2017-02-17 MED ORDER — METHOCARBAMOL 500 MG PO TABS: 500.0000 mg | ORAL_TABLET | Freq: Two times a day (BID) | ORAL | 0 refills | Status: AC
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
## 2017-02-17 NOTE — ED Notes (Signed)
Visual acuity completed by DRT, RN. 20/30 OD, 20/30 OS, 20/25 OU. Pt denies visual changes.

## 2017-02-23 ENCOUNTER — Encounter: Payer: Self-pay | Admitting: Neurology

## 2017-03-02 DIAGNOSIS — E039 Hypothyroidism, unspecified: Secondary | ICD-10-CM | POA: Diagnosis not present

## 2017-03-02 DIAGNOSIS — E785 Hyperlipidemia, unspecified: Secondary | ICD-10-CM | POA: Diagnosis not present

## 2017-03-05 DIAGNOSIS — R42 Dizziness and giddiness: Secondary | ICD-10-CM | POA: Diagnosis not present

## 2017-06-08 ENCOUNTER — Encounter: Payer: Self-pay | Admitting: Neurology

## 2017-06-08 ENCOUNTER — Ambulatory Visit (INDEPENDENT_AMBULATORY_CARE_PROVIDER_SITE_OTHER): Payer: 59 | Admitting: Neurology

## 2017-06-08 ENCOUNTER — Encounter

## 2017-06-08 VITALS — BP 96/68 | HR 81 | Ht 63.0 in | Wt 139.0 lb

## 2017-06-08 DIAGNOSIS — M542 Cervicalgia: Secondary | ICD-10-CM | POA: Diagnosis not present

## 2017-06-08 DIAGNOSIS — G43719 Chronic migraine without aura, intractable, without status migrainosus: Secondary | ICD-10-CM

## 2017-06-08 DIAGNOSIS — R51 Headache: Secondary | ICD-10-CM

## 2017-06-08 DIAGNOSIS — G4486 Cervicogenic headache: Secondary | ICD-10-CM

## 2017-06-08 MED ORDER — DICLOFENAC POTASSIUM(MIGRAINE) 50 MG PO PACK
50.0000 mg | PACK | ORAL | 0 refills | Status: AC
Start: 2017-06-08 — End: ?

## 2017-06-08 MED ORDER — DICLOFENAC POTASSIUM(MIGRAINE) 50 MG PO PACK: 50.0000 mg | PACK | ORAL | 3 refills | Status: AC
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
## 2017-06-08 NOTE — Addendum Note (Signed)
Addended by: Clois Comber on: 06/08/2017 01:02 PM   Modules accepted: Orders

## 2017-06-08 NOTE — Patient Instructions (Addendum)
1.  Stop the etodolac.  Instead, use Cambia at earliest onset of migraine.  Do not repeat dose for 24 hours but you are limited to no more than 2 days out of the week 2.  Try to limit use of Midrin since it does not work anyway. 3.  Between Midrin and Cambia, limit use to no more than 2 days out of the week. 4.  Continue gabapentin and methocarbamol for now. 5. Try Turmeric 500 mg 1-2 times daily, it may help to reduce inflammation. 6. We will refer you to Dr. Hulan Saas for evaluation of neck pain. 7..  Follow up in 3 months

## 2017-06-15 ENCOUNTER — Ambulatory Visit
Admission: RE | Admit: 2017-06-15 | Discharge: 2017-06-15 | Disposition: A | Discharge status: Home / Self Care | Payer: 59 | Source: Ambulatory Visit | Attending: Neurology | Admitting: Neurology

## 2017-06-15 DIAGNOSIS — G4486 Cervicogenic headache: Secondary | ICD-10-CM

## 2017-06-15 DIAGNOSIS — M542 Cervicalgia: Secondary | ICD-10-CM | POA: Diagnosis not present

## 2017-06-15 DIAGNOSIS — R51 Headache: Principal | ICD-10-CM

## 2017-06-19 ENCOUNTER — Telehealth: Payer: Self-pay

## 2017-06-19 NOTE — Telephone Encounter (Signed)
Called and spoke with Pt, advsd her of MRI results and recommendations

## 2017-06-19 NOTE — Telephone Encounter (Signed)
-----   Message from Pieter Partridge, DO sent at 06/18/2017  8:48 AM EDT ----- The MRI of cervical spine does show a disc bulge to the right side, but it looks very mild to me (nothing significant).  I would continue conservative management (medication and treatment by Dr. Tamala Julian).

## 2017-07-10 ENCOUNTER — Ambulatory Visit: Payer: 59 | Admitting: Family Medicine
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
## 2017-07-16 ENCOUNTER — Encounter: Payer: Self-pay | Admitting: Family Medicine

## 2017-07-16 ENCOUNTER — Ambulatory Visit (INDEPENDENT_AMBULATORY_CARE_PROVIDER_SITE_OTHER): Payer: 59 | Admitting: Family Medicine

## 2017-07-16 DIAGNOSIS — R51 Headache: Secondary | ICD-10-CM | POA: Diagnosis not present

## 2017-07-16 DIAGNOSIS — M999 Biomechanical lesion, unspecified: Secondary | ICD-10-CM | POA: Diagnosis not present

## 2017-07-16 DIAGNOSIS — G4486 Cervicogenic headache: Secondary | ICD-10-CM | POA: Insufficient documentation

## 2017-07-16 MED ORDER — VITAMIN D (ERGOCALCIFEROL) 1.25 MG (50000 UNIT) PO CAPS: 50000.0000 [IU] | ORAL_CAPSULE | ORAL | 0 refills | Status: DC

## 2017-07-16 NOTE — Assessment & Plan Note (Signed)
Patient has been cervicogenic headache.  Discussed icing regimen and home exercise.  Has been doing certain things more intermittent and we discussed exercises on a more regular basis.  Discussed different medications could be beneficial.  Attempted osteopathic manipulation.  Patient will follow-up with me again in 3 to 4 weeks

## 2017-07-16 NOTE — Patient Instructions (Signed)
Good to see you  Ice 20 minutes 2 times daily. Usually after activity and before bed. Exercises 3 times a week.  pennsaid pinkie amount topically 2 times daily as needed.  Try to cut down on the gabapentin  Attempted manipulation and should help  Once weekly vitamin D for 12 weeks Over the counter get  Turmeric 500mg  daily  Tart cherry extract any dose at night CoQ10 400mg  daily  See me again in 34 weeks

## 2017-07-16 NOTE — Assessment & Plan Note (Signed)
Decision today to treat with OMT was based on Physical Exam  After verbal consent patient was treated with HVLA, ME, FPR techniques in cervical, thoracic, lumbar and sacral areas  Patient tolerated the procedure well with improvement in symptoms  Patient given exercises, stretches and lifestyle modifications  See medications in patient instructions if given  Patient will follow up in 4 weeks 

## 2017-08-01 NOTE — Progress Notes (Signed)
Rcvd fax from [place], requesting clinicals and Dx for PA, faxed info
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
## 2017-08-09 ENCOUNTER — Ambulatory Visit (INDEPENDENT_AMBULATORY_CARE_PROVIDER_SITE_OTHER): Payer: 59 | Admitting: Family Medicine

## 2017-08-09 ENCOUNTER — Encounter: Payer: Self-pay | Admitting: Family Medicine

## 2017-08-09 VITALS — BP 126/80 | HR 64 | Ht 63.0 in | Wt 134.0 lb

## 2017-08-09 DIAGNOSIS — R51 Headache: Secondary | ICD-10-CM

## 2017-08-09 DIAGNOSIS — G4486 Cervicogenic headache: Secondary | ICD-10-CM

## 2017-08-09 DIAGNOSIS — M999 Biomechanical lesion, unspecified: Secondary | ICD-10-CM | POA: Diagnosis not present

## 2017-08-09 NOTE — Patient Instructions (Signed)
6 weeks

## 2017-08-09 NOTE — Assessment & Plan Note (Signed)
Cervicogenic headaches.  Discussed icing regimen and home exercises.  Discussed which activities to do which wants to avoid.  Patient is to increase activity as tolerated.  Patient will follow-up with me again in 6 weeks.  Responded well to manipulation therapy.

## 2017-08-09 NOTE — Assessment & Plan Note (Addendum)
Decision today to treat with OMT was based on Physical Exam  After verbal consent patient was treated with HVLA, ME, FPR techniques in cervical, thoracic, rib, lumbar and sacral areas  Patient tolerated the procedure well with improvement in symptoms  Patient given exercises, stretches and lifestyle modifications  See medications in patient instructions if given  Patient will follow up in 6 weeks 

## 2017-09-20 ENCOUNTER — Ambulatory Visit: Payer: 59 | Admitting: Family Medicine
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
## 2017-09-25 ENCOUNTER — Encounter: Payer: Self-pay | Admitting: Neurology

## 2017-09-25 ENCOUNTER — Ambulatory Visit (INDEPENDENT_AMBULATORY_CARE_PROVIDER_SITE_OTHER): Payer: 59 | Admitting: Neurology

## 2017-09-25 VITALS — BP 106/70 | HR 80 | Ht 63.0 in | Wt 138.0 lb

## 2017-09-25 DIAGNOSIS — R51 Headache: Secondary | ICD-10-CM | POA: Diagnosis not present

## 2017-09-25 DIAGNOSIS — G44219 Episodic tension-type headache, not intractable: Secondary | ICD-10-CM | POA: Diagnosis not present

## 2017-09-25 DIAGNOSIS — G4486 Cervicogenic headache: Secondary | ICD-10-CM

## 2017-09-25 DIAGNOSIS — G43009 Migraine without aura, not intractable, without status migrainosus: Secondary | ICD-10-CM | POA: Diagnosis not present

## 2017-09-25 NOTE — Patient Instructions (Signed)
Follow up in 5 months
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
## 2017-10-04 ENCOUNTER — Encounter: Payer: Self-pay | Admitting: Family Medicine

## 2017-10-04 ENCOUNTER — Ambulatory Visit (INDEPENDENT_AMBULATORY_CARE_PROVIDER_SITE_OTHER): Payer: 59 | Admitting: Family Medicine

## 2017-10-04 VITALS — BP 110/88 | HR 83 | Ht 63.0 in | Wt 138.0 lb

## 2017-10-04 DIAGNOSIS — M999 Biomechanical lesion, unspecified: Secondary | ICD-10-CM | POA: Diagnosis not present

## 2017-10-04 DIAGNOSIS — R51 Headache: Secondary | ICD-10-CM

## 2017-10-04 DIAGNOSIS — G4486 Cervicogenic headache: Secondary | ICD-10-CM

## 2017-10-04 NOTE — Assessment & Plan Note (Signed)
Decision today to treat with OMT was based on Physical Exam  After verbal consent patient was treated with HVLA, ME, FPR techniques in cervical, thoracic, lumbar and sacral areas  Patient tolerated the procedure well with improvement in symptoms  Patient given exercises, stretches and lifestyle modifications  See medications in patient instructions if given  Patient will follow up in 8 weeks 

## 2017-10-04 NOTE — Patient Instructions (Signed)
Good to see you  I am impressed Keep it up  I will see you again in 2 months

## 2017-10-04 NOTE — Assessment & Plan Note (Signed)
Significant improvement.  Encourage patient to continue the conservative therapy.  Patient is taking once weekly vitamin D and states that it feels like it is making a difference.  Patient is taking gabapentin intermittently.  Responding well to manipulation.  Follow-up again in 8 weeks

## 2017-10-09 ENCOUNTER — Other Ambulatory Visit: Payer: Self-pay | Admitting: Family Medicine

## 2017-10-24 DIAGNOSIS — Z23 Encounter for immunization: Secondary | ICD-10-CM | POA: Diagnosis not present
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
## 2017-12-04 ENCOUNTER — Encounter: Payer: Self-pay | Admitting: Family Medicine

## 2017-12-04 ENCOUNTER — Ambulatory Visit (INDEPENDENT_AMBULATORY_CARE_PROVIDER_SITE_OTHER): Payer: 59 | Admitting: Family Medicine

## 2017-12-04 VITALS — BP 118/82 | HR 70 | Ht 63.0 in | Wt 136.0 lb

## 2017-12-04 DIAGNOSIS — M999 Biomechanical lesion, unspecified: Secondary | ICD-10-CM | POA: Diagnosis not present

## 2017-12-04 DIAGNOSIS — R51 Headache: Secondary | ICD-10-CM

## 2017-12-04 DIAGNOSIS — G4486 Cervicogenic headache: Secondary | ICD-10-CM

## 2017-12-04 NOTE — Patient Instructions (Signed)
Good to see you  6 weeks

## 2017-12-04 NOTE — Assessment & Plan Note (Signed)
Decision today to treat with OMT was based on Physical Exam  After verbal consent patient was treated with HVLA, ME, FPR techniques in cervical, thoracic, rib areas  Patient tolerated the procedure well with improvement in symptoms  Patient given exercises, stretches and lifestyle modifications  See medications in patient instructions if given  Patient will follow up in 4-8 weeks 

## 2017-12-04 NOTE — Assessment & Plan Note (Signed)
Patient has had very minimal headaches overall.  Continue the same medications.  We discussed posture and ergonomics.  Patient is feeling better overall.  Follow-up again in 4 to 8 weeks

## 2017-12-29 ENCOUNTER — Other Ambulatory Visit: Payer: Self-pay | Admitting: Family Medicine
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
## 2018-01-15 ENCOUNTER — Ambulatory Visit: Payer: 59 | Admitting: Family Medicine

## 2018-02-22 DIAGNOSIS — E039 Hypothyroidism, unspecified: Secondary | ICD-10-CM | POA: Diagnosis not present

## 2018-02-22 DIAGNOSIS — Z0000 Encounter for general adult medical examination without abnormal findings: Secondary | ICD-10-CM | POA: Diagnosis not present

## 2018-02-22 DIAGNOSIS — E785 Hyperlipidemia, unspecified: Secondary | ICD-10-CM | POA: Diagnosis not present

## 2018-02-25 ENCOUNTER — Ambulatory Visit: Payer: 59 | Admitting: Neurology

## 2018-04-01 ENCOUNTER — Encounter: Payer: Self-pay | Admitting: Neurology

## 2018-04-09 DIAGNOSIS — D120 Benign neoplasm of cecum: Secondary | ICD-10-CM | POA: Diagnosis not present

## 2018-04-09 DIAGNOSIS — Z1211 Encounter for screening for malignant neoplasm of colon: Secondary | ICD-10-CM | POA: Diagnosis not present

## 2018-04-09 DIAGNOSIS — D128 Benign neoplasm of rectum: Secondary | ICD-10-CM | POA: Diagnosis not present

## 2018-05-09 ENCOUNTER — Other Ambulatory Visit: Payer: Self-pay

## 2018-05-09 MED ORDER — VITAMIN D (ERGOCALCIFEROL) 1.25 MG (50000 UNIT) PO CAPS: ORAL_CAPSULE | ORAL | 0 refills | Status: DC

## 2018-08-28 ENCOUNTER — Other Ambulatory Visit: Payer: Self-pay

## 2018-08-28 MED ORDER — VITAMIN D (ERGOCALCIFEROL) 1.25 MG (50000 UNIT) PO CAPS: ORAL_CAPSULE | ORAL | 0 refills | Status: AC

## 2019-06-17 IMAGING — MR MR CERVICAL SPINE W/O CM
4 of 5 series · 30 of 48 positions shown · non-contrast
Comparison: Prior CT from 02/17/2017.

CLINICAL DATA: Initial evaluation for chronic right-sided neck
pain. History of migraines.

EXAM:
MRI CERVICAL SPINE WITHOUT CONTRAST
TECHNIQUE: Multiplanar, multisequence MR imaging of the cervical spine was
performed. No intravenous contrast was administered.

[Series 2: T2 · sagittal · 3.0mm · 0.41mm/px · 7 of 13 slices shown (1 of 2)]
[im 1/13]
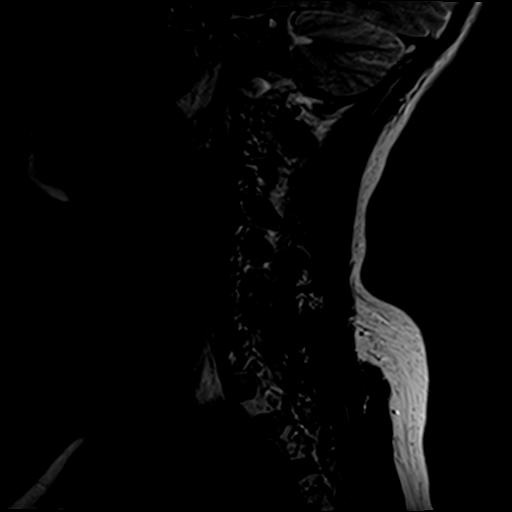
[im 3/13]
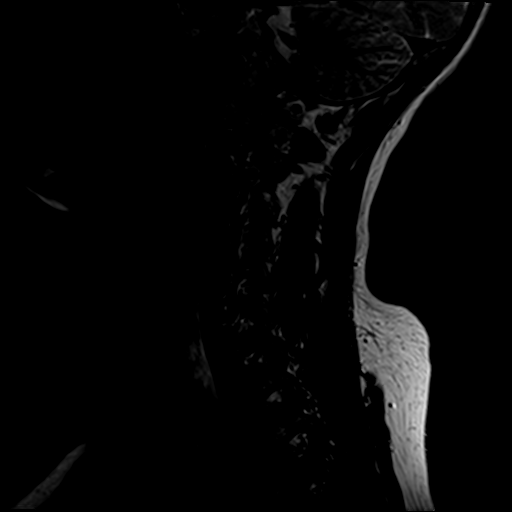
[im 5/13]
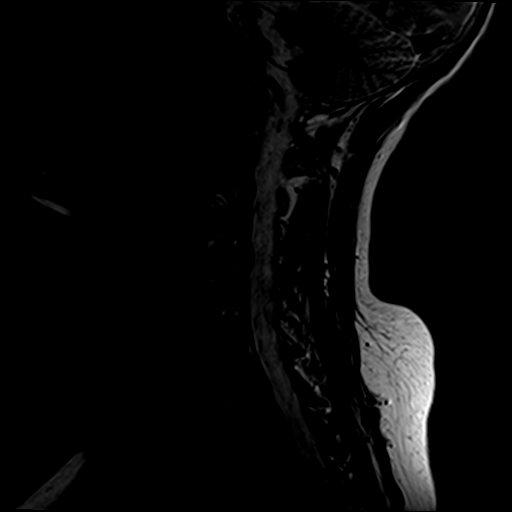
[im 7/13]
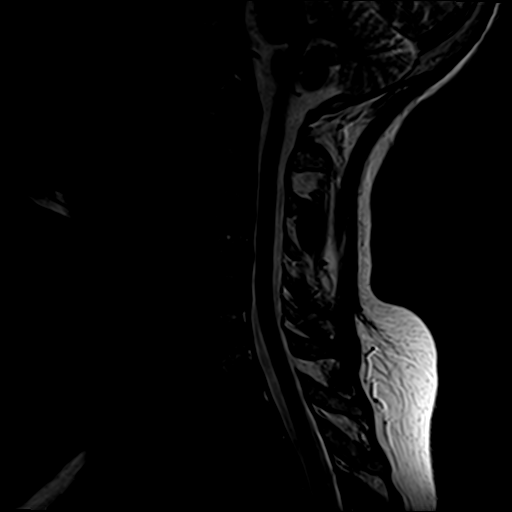
[im 9/13]
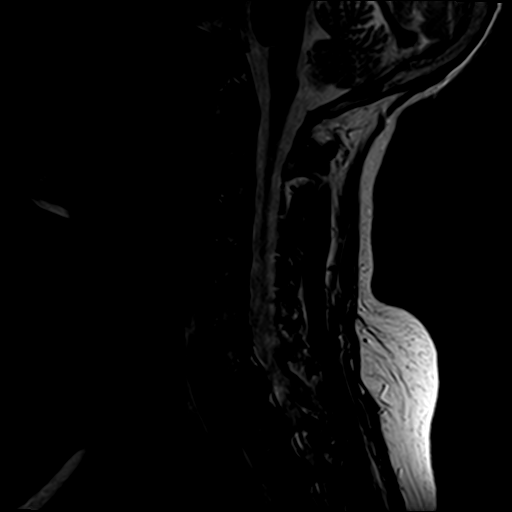
[im 11/13]
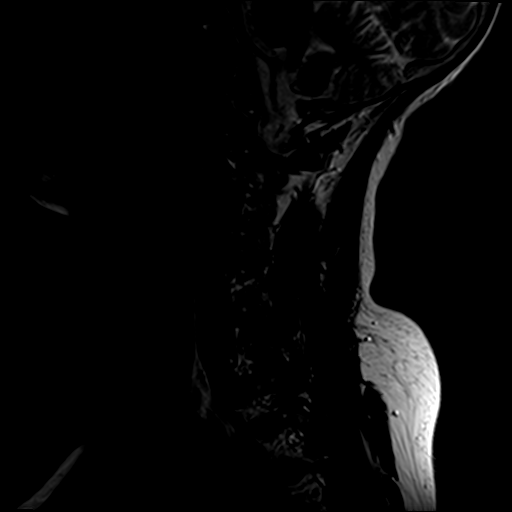
[im 13/13]
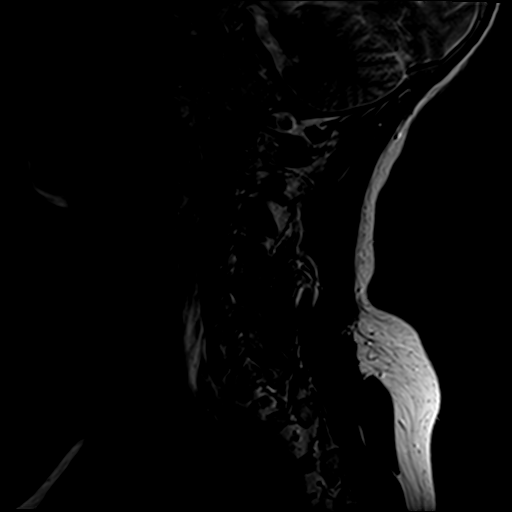

[Series 3: T1 · sagittal · 3.0mm · 0.41mm/px · 7 of 13 slices shown]
[im 1/13]
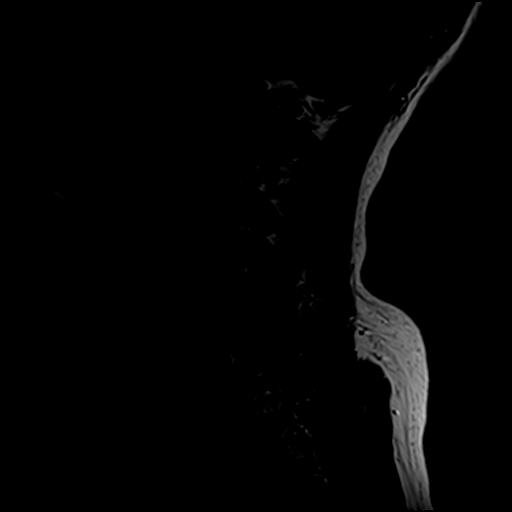
[im 3/13]
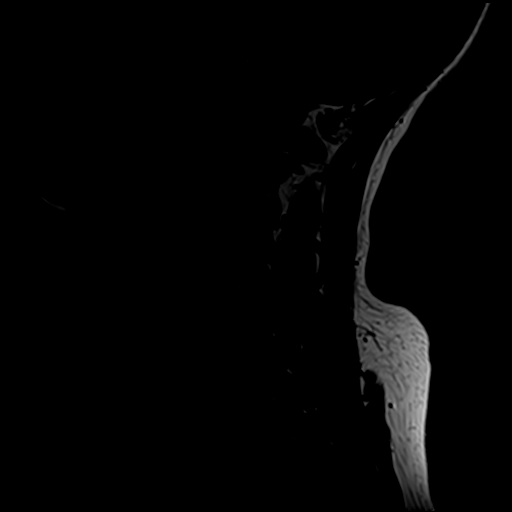
[im 5/13]
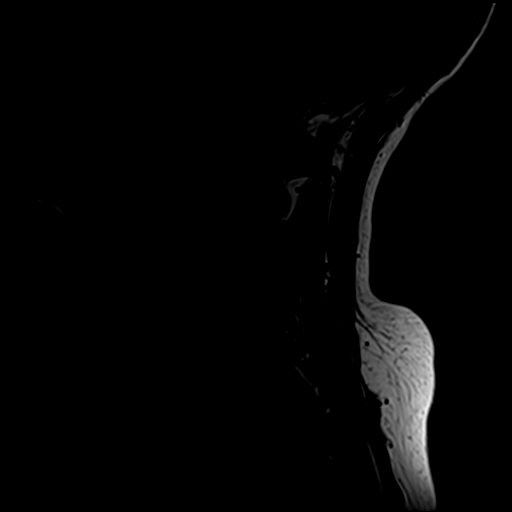
[im 7/13]
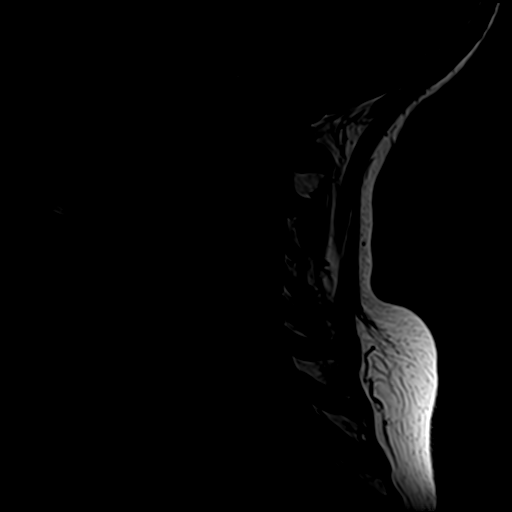
[im 9/13]
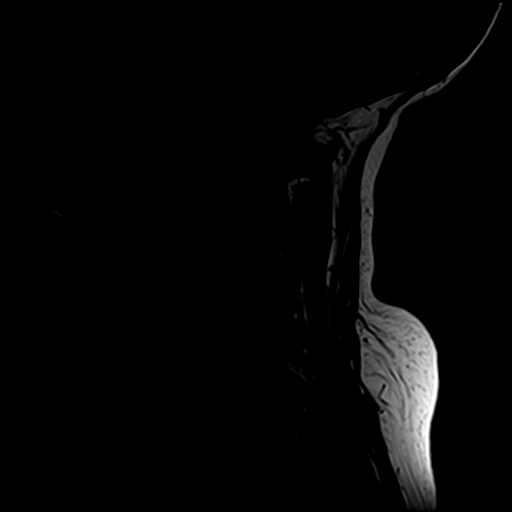
[im 11/13]
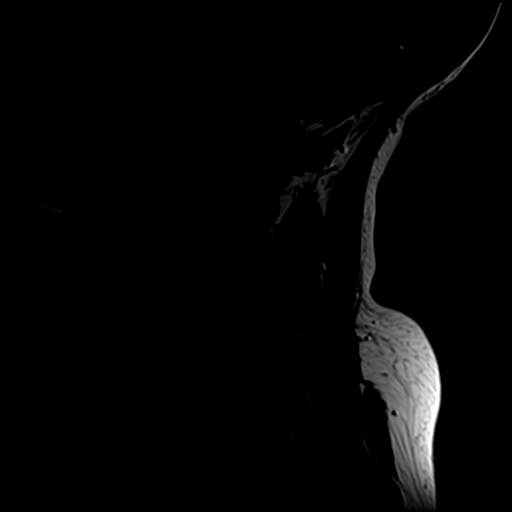
[im 13/13]
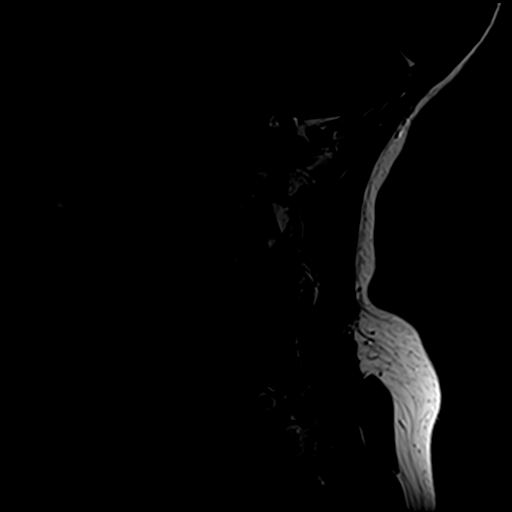

[Series 4: STIR · sagittal · 3.0mm · 0.82mm/px · 7 of 13 slices shown]
[im 1/13]
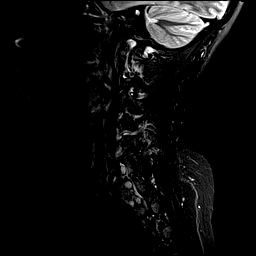
[im 3/13]
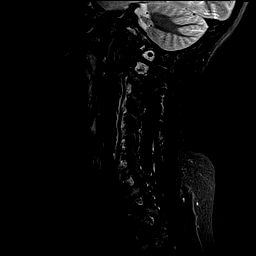
[im 5/13]
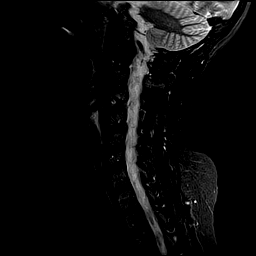
[im 7/13]
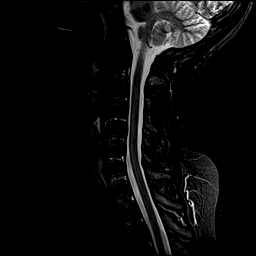
[im 9/13]
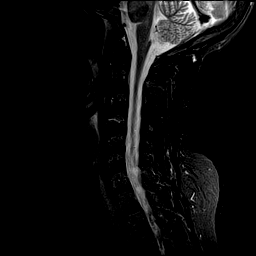
[im 11/13]
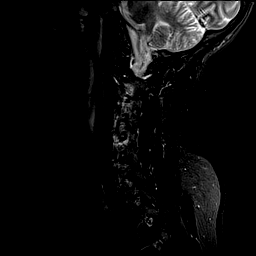
[im 13/13]
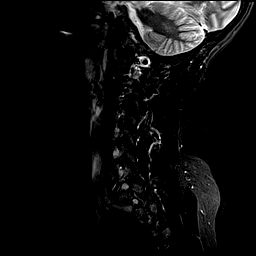

[Series 6: T2 · axial · 3.0mm · 0.70mm/px · z∈[-73,+19]mm · 9 of 25 slices shown (2 of 2)]
[im 1/25]
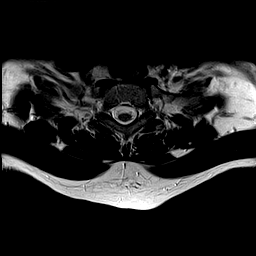
[im 5/25]
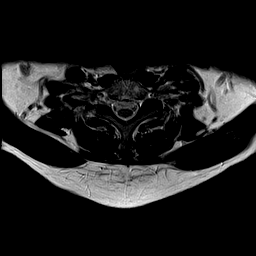
[im 9/25]
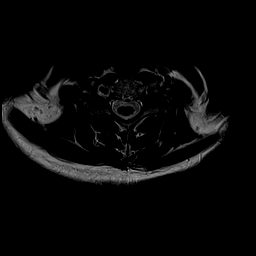
[im 11/25]
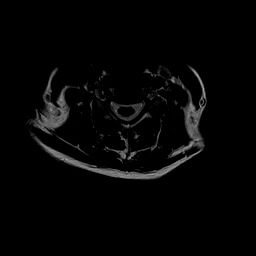
[im 13/25]
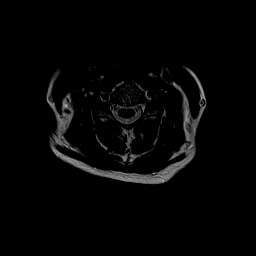
[im 15/25]
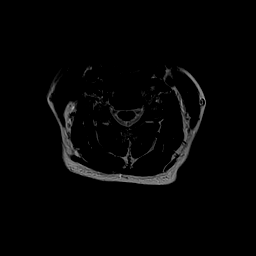
[im 17/25]
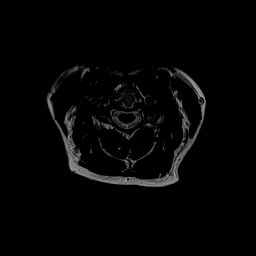
[im 21/25]
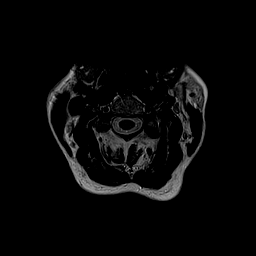
[im 25/25]
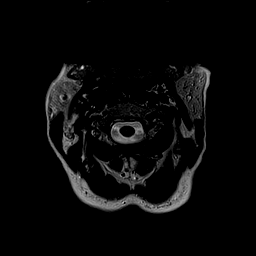

[30 of 48 positions shown; findings below may reference images not displayed]

FINDINGS: Alignment: Mild straightening of the normal cervical lordosis. No
listhesis.

Vertebrae: Vertebral body heights well maintained without evidence
for acute or chronic fracture. Bone marrow signal intensity within
normal limits. No discrete or worrisome osseous lesions. No abnormal
marrow edema.

Cord: Signal intensity within the cervical spinal cord is normal.

Posterior Fossa, vertebral arteries, paraspinal tissues: Visualized
brain and posterior fossa within normal limits. Craniocervical
junction normal. Paraspinous and prevertebral soft tissues are
normal. Normal intravascular flow voids present within the vertebral
arteries bilaterally.

Disc levels:

C2-C3: Unremarkable.

C3-C4: Mild annular disc bulge. Mild facet hypertrophy. No
significant canal or foraminal stenosis.

C4-C5: Mild disc bulge, a centric to the right. Right-sided
uncovertebral hypertrophy. Resultant mild to moderate right C5
foraminal stenosis. No significant canal narrowing. No left
foraminal encroachment.

C5-C6: Mild disc bulge. No significant canal or foraminal stenosis.

C6-C7:  Unremarkable.

C7-T1: Mild left-sided facet hypertrophy. No significant canal or
foraminal stenosis.

Visualized upper thoracic spine demonstrates no significant
findings.
IMPRESSION: 1. Right eccentric disc bulge with uncovertebral hypertrophy at C4-5
with resultant mild-to-moderate right C5 foraminal stenosis.
2. Additional mild degenerative disc bulging at C3-4 and C5-6
without significant stenosis
